# Patient Record
Sex: Male | Born: 1990 | Race: Black or African American | Hispanic: No | Marital: Single
Health system: Southern US, Community
[De-identification: ages and names within clinical notes are randomized; demographics above are authoritative.]

---
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
## 2019-06-08 ENCOUNTER — Encounter (HOSPITAL_COMMUNITY): Payer: Self-pay | Admitting: Emergency Medicine

## 2019-06-08 ENCOUNTER — Other Ambulatory Visit: Payer: Self-pay

## 2019-06-08 ENCOUNTER — Emergency Department (HOSPITAL_COMMUNITY)
Admission: EM | Admit: 2019-06-08 | Discharge: 2019-06-08 | Disposition: A | Discharge status: Home / Self Care | Payer: Self-pay | Attending: Emergency Medicine | Admitting: Emergency Medicine

## 2019-06-08 DIAGNOSIS — U071 COVID-19: Secondary | ICD-10-CM | POA: Insufficient documentation

## 2019-06-08 DIAGNOSIS — Z7189 Other specified counseling: Secondary | ICD-10-CM | POA: Insufficient documentation

## 2019-06-08 MED ORDER — ACETAMINOPHEN 325 MG PO TABS
650.0000 mg | ORAL_TABLET | Freq: Once | ORAL | Status: AC
  Administered 2019-06-08: 650 mg via ORAL
  Filled 2019-06-08: qty 2

## 2019-06-08 NOTE — ED Triage Notes (Signed)
Pt reports positive covid test on sept 25th, states he had mild symptoms of cough, h/a at that time, states he was told to quarantine for 10 days and be retested, retest was 10/6 and states he was still positive on 10/6, asymptomatic, states he wants to see if he can get antibiotics or some kind of medication for his positive test. Pt denies any symptoms at this time, resp e/u, nad.

## 2019-06-08 NOTE — ED Provider Notes (Signed)
[facility] EMERGENCY DEPARTMENT Provider Note   CSN: [id number] Arrival date & time: 06/08/19  6644     History   Chief Complaint Chief Complaint  Patient presents with  . covid +    HPI Terry Lynch is a 28 y.o. male.     The history is provided by the patient. No language interpreter was used.     28 year old male presenting to the ED requesting for antibiotic treatment for his COVID-19.  Patient report he developed cough and headache 2 weeks ago.  He was tested positive for COVID-19 on September 25.  He was instructed to quarantine for 10 days and and be retested.  Patient states he was retested on October 6 and it was indicated he is positive.  Patient mention initially he was having some mild headache, congestion, occasional cough, generalized fatigue and body aches.  Symptoms lasting for approximately 3 days and has since resolved.  He has been symptom-free for the past week.  He is here today requesting for antibiotic since he tested positive on retest 3 days ago.  Aside from mild temporal headache he does not have any other symptom.  History reviewed. No pertinent past medical history.  There are no active problems to display for this patient.   History reviewed. No pertinent surgical history.      Home Medications    Prior to Admission medications   Not on File    Family History No family history on file.  Social History Social History   Tobacco Use  . Smoking status: Not on file  Substance Use Topics  . Alcohol use: Not on file  . Drug use: Not on file     Allergies   Patient has no known allergies.   Review of Systems Review of Systems  All other systems reviewed and are negative.    Physical Exam Updated Vital Signs BP 117/81 (BP Location: Right Arm)   Pulse 70   Temp 98.4 F (36.9 C) (Oral)   Resp 20   SpO2 99%   Physical Exam Vitals signs and nursing note reviewed.  Constitutional:      General: He is not  in acute distress.    Appearance: He is well-developed.  HENT:     Head: Atraumatic.     Mouth/Throat:     Mouth: Mucous membranes are moist.  Eyes:     Conjunctiva/sclera: Conjunctivae normal.  Neck:     Musculoskeletal: Neck supple. No neck rigidity.  Cardiovascular:     Rate and Rhythm: Normal rate and regular rhythm.     Pulses: Normal pulses.     Heart sounds: Normal heart sounds.  Pulmonary:     Breath sounds: Normal breath sounds.  Abdominal:     Palpations: Abdomen is soft.  Skin:    Findings: No rash.  Neurological:     Mental Status: He is alert and oriented to person, place, and time.  Psychiatric:        Mood and Affect: Mood normal.      ED Treatments / Results  Labs (all labs ordered are listed, but only abnormal results are displayed) Labs Reviewed - No data to display  EKG None  Radiology No results found.  Procedures Procedures (including critical care time)  Medications Ordered in ED Medications - No data to display   Initial Impression / Assessment and Plan / ED Course  I have reviewed the triage vital signs and the nursing notes.  Pertinent labs &  imaging results that were available during my care of the patient were reviewed by me and considered in my medical decision making (see chart for details).        BP 117/81 (BP Location: Right Arm)   Pulse 70   Temp 98.4 F (36.9 C) (Oral)   Resp 20   SpO2 99%    Final Clinical Impressions(s) / ED Diagnoses   Final diagnoses:  Advice given about COVID-19 virus infection    ED Discharge Orders    None     10:12 AM Patient test positive for COVID-19 approximately 2 weeks ago.  He has been symptom-free for more than a week.  He did have a retest of COVID-19 approximately 3 days ago that came back positive even though he does not have any symptoms and he voiced concern and requesting for antibiotic.  I discussed with patient that antibiotic would not be beneficial in this setting.  I  felt patient is stable to return back to work since his symptom has abated and he has been in quarantine for more than 10 days.  Tylenol given for his headache.  Return precaution discussed.  Terry Lynch was evaluated in Emergency Department on 06/08/2019 for the symptoms described in the history of present illness. He was evaluated in the context of the global COVID-19 pandemic, which necessitated consideration that the patient might be at risk for infection with the SARS-CoV-2 virus that causes COVID-19. Institutional protocols and algorithms that pertain to the evaluation of patients at risk for COVID-19 are in a state of rapid change based on information released by regulatory bodies including the CDC and federal and state organizations. These policies and algorithms were followed during the patient's care in the ED.    Fayrene Helper, PA-C 06/08/19 1015    Raeford Razor, MD 06/08/19 1114

## 2019-06-08 NOTE — ED Notes (Signed)
Pt A&OX4, ambulatory at d/c with independent steady gait  

## 2019-06-26 ENCOUNTER — Other Ambulatory Visit: Payer: Self-pay

## 2019-06-26 DIAGNOSIS — Z202 Contact with and (suspected) exposure to infections with a predominantly sexual mode of transmission: Secondary | ICD-10-CM | POA: Insufficient documentation

## 2019-06-27 ENCOUNTER — Encounter (HOSPITAL_COMMUNITY): Payer: Self-pay | Admitting: Emergency Medicine

## 2019-06-27 ENCOUNTER — Emergency Department (HOSPITAL_COMMUNITY)
Admission: EM | Admit: 2019-06-27 | Discharge: 2019-06-27 | Disposition: A | Discharge status: Home / Self Care | Payer: Self-pay | Attending: Emergency Medicine | Admitting: Emergency Medicine

## 2019-06-27 ENCOUNTER — Other Ambulatory Visit: Payer: Self-pay

## 2019-06-27 DIAGNOSIS — Z202 Contact with and (suspected) exposure to infections with a predominantly sexual mode of transmission: Secondary | ICD-10-CM

## 2019-06-27 LAB — URINALYSIS, ROUTINE W REFLEX MICROSCOPIC
Bacteria, UA: NONE SEEN
Bilirubin Urine: NEGATIVE
Glucose, UA: NEGATIVE mg/dL
Hgb urine dipstick: NEGATIVE
Ketones, ur: NEGATIVE mg/dL
Leukocytes,Ua: NEGATIVE
Nitrite: NEGATIVE
Protein, ur: 30 mg/dL — AB
Specific Gravity, Urine: 1.035 — ABNORMAL HIGH (ref 1.005–1.030)
pH: 6 (ref 5.0–8.0)

## 2019-06-27 MED ORDER — CEFTRIAXONE SODIUM 250 MG IJ SOLR
250.0000 mg | Freq: Once | INTRAMUSCULAR | Status: AC
  Administered 2019-06-27: 04:00:00 250 mg via INTRAMUSCULAR
  Filled 2019-06-27: qty 250

## 2019-06-27 MED ORDER — AZITHROMYCIN 250 MG PO TABS
1000.0000 mg | ORAL_TABLET | Freq: Once | ORAL | Status: AC
Start: 2019-06-27 — End: 2019-06-27
  Administered 2019-06-27: 04:00:00 1000 mg via ORAL
  Filled 2019-06-27: qty 4

## 2019-06-27 MED ORDER — STERILE WATER FOR INJECTION IJ SOLN
INTRAMUSCULAR | Status: AC
  Filled 2019-06-27: qty 10

## 2019-06-27 NOTE — Discharge Instructions (Signed)
Safe sex practices. Please follow-up with your primary care doctor or health department for non-emergent STD screenings. Return here for any new/acute changes.

## 2019-06-27 NOTE — ED Provider Notes (Signed)
[facility] EMERGENCY DEPARTMENT Provider Note   CSN: [id number] Arrival date & time: 06/26/19  2359     History   Chief Complaint Chief Complaint  Patient presents with  . Exposure to STD    HPI Terry Lynch is a 28 y.o. male.     The history is provided by the patient and medical records.  Exposure to STD     28 y.o. M here requested STD screening.  States he has had a few sexual partners over the past 3 months.  States his most recent sexual partner from earlier this month was recently tested for STD's but was told she had some findings on exam that were concerning for STD.  He has not experienced any discharge, dysuria, hematuria, or penile lesions.  No rash.  Hx of STD in the past but was treated.  History reviewed. No pertinent past medical history.  There are no active problems to display for this patient.   History reviewed. No pertinent surgical history.      Home Medications    Prior to Admission medications   Not on File    Family History No family history on file.  Social History Social History   Tobacco Use  . Smoking status: Never Smoker  . Smokeless tobacco: Never Used  Substance Use Topics  . Alcohol use: Never    Frequency: Never  . Drug use: Never     Allergies   Patient has no known allergies.   Review of Systems Review of Systems  Genitourinary:       STD screening  All other systems reviewed and are negative.    Physical Exam Updated Vital Signs BP 111/76 (BP Location: Right Arm)   Pulse 80   Temp 98.5 F (36.9 C) (Oral)   Resp 19   SpO2 98%   Physical Exam Vitals signs and nursing note reviewed.  Constitutional:      Appearance: He is well-developed.  HENT:     Head: Normocephalic and atraumatic.  Eyes:     Conjunctiva/sclera: Conjunctivae normal.     Pupils: Pupils are equal, round, and reactive to light.  Neck:     Musculoskeletal: Normal range of motion.  Cardiovascular:     Rate  and Rhythm: Normal rate and regular rhythm.     Heart sounds: Normal heart sounds.  Pulmonary:     Effort: Pulmonary effort is normal.     Breath sounds: Normal breath sounds.  Abdominal:     General: Bowel sounds are normal.     Palpations: Abdomen is soft.  Genitourinary:    Comments: Chaperoned by NT Normal penis without discharge, no lesions noted Musculoskeletal: Normal range of motion.  Skin:    General: Skin is warm and dry.  Neurological:     Mental Status: He is alert and oriented to person, place, and time.      ED Treatments / Results  Labs (all labs ordered are listed, but only abnormal results are displayed) Labs Reviewed  URINALYSIS, ROUTINE W REFLEX MICROSCOPIC - Abnormal; Notable for the following components:      Result Value   APPearance HAZY (*)    Specific Gravity, Urine 1.035 (*)    Protein, ur 30 (*)    All other components within normal limits  GC/CHLAMYDIA PROBE AMP ([place]) NOT AT [facility]    EKG None  Radiology No results found.  Procedures Procedures (including critical care time)  Medications Ordered in ED Medications  sterile water (preservative free) injection (has no administration in time range)  cefTRIAXone (ROCEPHIN) injection 250 mg (250 mg Intramuscular Given 06/27/19 0337)  azithromycin (ZITHROMAX) tablet 1,000 mg (1,000 mg Oral Given 06/27/19 6979)     Initial Impression / Assessment and Plan / ED Course  I have reviewed the triage vital signs and the nursing notes.  Pertinent labs & imaging results that were available during my care of the patient were reviewed by me and considered in my medical decision making (see chart for details).   28 y.o. M here for STD screening.  He is asymptomatic but does report several sexual partners, one with some recent symptoms.  Gc/chl swab sent.  Treated empirically here with rocephin/azithromycin.  Advised to abstain from sexual activity for at least 1 week.  Will need to notify partners  if test is positive.  Recommended to follow-up with PCP and/or health dept for future STD screening.   Return here for new concerns.  Final Clinical Impressions(s) / ED Diagnoses   Final diagnoses:  Possible exposure to STD    ED Discharge Orders    None       Larene Pickett, PA-C 06/27/19 0433    Ward, Delice Bison, DO 06/27/19 [phone]

## 2019-06-27 NOTE — ED Triage Notes (Signed)
Patient requesting STD screening , patient stated multiple sex partners , denies any symptoms , no fever or chills .

## 2019-06-28 LAB — GC/CHLAMYDIA PROBE AMP (~~LOC~~) NOT AT ARMC
Chlamydia: NEGATIVE
Neisseria Gonorrhea: NEGATIVE

## 2020-03-09 ENCOUNTER — Emergency Department (HOSPITAL_COMMUNITY)
Admission: EM | Admit: 2020-03-09 | Discharge: 2020-03-09 | Disposition: A | Discharge status: Home / Self Care | Payer: Self-pay | Attending: Emergency Medicine | Admitting: Emergency Medicine

## 2020-03-09 ENCOUNTER — Emergency Department (HOSPITAL_COMMUNITY): Payer: Self-pay

## 2020-03-09 ENCOUNTER — Other Ambulatory Visit: Payer: Self-pay

## 2020-03-09 ENCOUNTER — Encounter (HOSPITAL_COMMUNITY): Payer: Self-pay

## 2020-03-09 DIAGNOSIS — S71031A Puncture wound without foreign body, right hip, initial encounter: Secondary | ICD-10-CM

## 2020-03-09 DIAGNOSIS — S30811A Abrasion of abdominal wall, initial encounter: Secondary | ICD-10-CM | POA: Insufficient documentation

## 2020-03-09 DIAGNOSIS — Y999 Unspecified external cause status: Secondary | ICD-10-CM | POA: Insufficient documentation

## 2020-03-09 DIAGNOSIS — Y9289 Other specified places as the place of occurrence of the external cause: Secondary | ICD-10-CM | POA: Insufficient documentation

## 2020-03-09 DIAGNOSIS — Z23 Encounter for immunization: Secondary | ICD-10-CM | POA: Insufficient documentation

## 2020-03-09 DIAGNOSIS — S71001A Unspecified open wound, right hip, initial encounter: Secondary | ICD-10-CM | POA: Insufficient documentation

## 2020-03-09 DIAGNOSIS — W3400XA Accidental discharge from unspecified firearms or gun, initial encounter: Secondary | ICD-10-CM | POA: Insufficient documentation

## 2020-03-09 DIAGNOSIS — S32424A Nondisplaced fracture of posterior wall of right acetabulum, initial encounter for closed fracture: Secondary | ICD-10-CM | POA: Insufficient documentation

## 2020-03-09 DIAGNOSIS — Y939 Activity, unspecified: Secondary | ICD-10-CM | POA: Insufficient documentation

## 2020-03-09 LAB — COMPREHENSIVE METABOLIC PANEL
ALT: 16 U/L (ref 0–44)
AST: 27 U/L (ref 15–41)
Albumin: 3.7 g/dL (ref 3.5–5.0)
Alkaline Phosphatase: 42 U/L (ref 38–126)
Anion gap: 11 (ref 5–15)
BUN: 11 mg/dL (ref 6–20)
CO2: 20 mmol/L — ABNORMAL LOW (ref 22–32)
Calcium: 8.7 mg/dL — ABNORMAL LOW (ref 8.9–10.3)
Chloride: 104 mmol/L (ref 98–111)
Creatinine, Ser: 0.96 mg/dL (ref 0.61–1.24)
GFR calc Af Amer: >60 mL/min (ref >60)
GFR calc non Af Amer: >60 mL/min (ref >60)
Glucose, Bld: 123 mg/dL — ABNORMAL HIGH (ref 70–99)
Potassium: 3.3 mmol/L — ABNORMAL LOW (ref 3.5–5.1)
Sodium: 135 mmol/L (ref 135–145)
Total Bilirubin: 0.6 mg/dL (ref 0.3–1.2)
Total Protein: 6.1 g/dL — ABNORMAL LOW (ref 6.5–8.1)

## 2020-03-09 LAB — CBC
HCT: 40.9 % (ref 39.0–52.0)
Hemoglobin: 13.2 g/dL (ref 13.0–17.0)
MCH: 26.5 pg (ref 26.0–34.0)
MCHC: 32.3 g/dL (ref 30.0–36.0)
MCV: 82.1 fL (ref 80.0–100.0)
Platelets: 266 10*3/uL (ref 150–400)
RBC: 4.98 MIL/uL (ref 4.22–5.81)
RDW: 14.8 % (ref 11.5–15.5)
WBC: 6.6 10*3/uL (ref 4.0–10.5)
nRBC: 0 % (ref 0.0–0.2)

## 2020-03-09 LAB — ETHANOL: Alcohol, Ethyl (B): <10 mg/dL (ref <10)

## 2020-03-09 LAB — CBG MONITORING, ED: Glucose-Capillary: 112 mg/dL — ABNORMAL HIGH (ref 70–99)

## 2020-03-09 LAB — SAMPLE TO BLOOD BANK

## 2020-03-09 LAB — PROTIME-INR
INR: 1.1 (ref 0.8–1.2)
Prothrombin Time: 13.6 seconds (ref 11.4–15.2)

## 2020-03-09 MED ORDER — FENTANYL CITRATE (PF) 100 MCG/2ML IJ SOLN: 100.0000 ug | Freq: Once | INTRAMUSCULAR | Status: AC

## 2020-03-09 MED ORDER — IOHEXOL 300 MG/ML  SOLN
100.0000 mL | Freq: Once | INTRAMUSCULAR | Status: AC | PRN
  Administered 2020-03-09: 100 mL via INTRAVENOUS

## 2020-03-09 MED ORDER — SODIUM CHLORIDE 0.9 % IV BOLUS
1000.0000 mL | Freq: Once | INTRAVENOUS | Status: AC
  Administered 2020-03-09: 1000 mL via INTRAVENOUS

## 2020-03-09 MED ORDER — TETANUS-DIPHTH-ACELL PERTUSSIS 5-2.5-18.5 LF-MCG/0.5 IM SUSP
0.5000 mL | Freq: Once | INTRAMUSCULAR | Status: AC
  Administered 2020-03-09: 0.5 mL via INTRAMUSCULAR
  Filled 2020-03-09: qty 0.5

## 2020-03-09 MED ORDER — FENTANYL CITRATE (PF) 100 MCG/2ML IJ SOLN
INTRAMUSCULAR | Status: AC
  Administered 2020-03-09: 100 ug via INTRAVENOUS
  Filled 2020-03-09: qty 2

## 2020-03-09 MED ORDER — KETOROLAC TROMETHAMINE 30 MG/ML IJ SOLN
30.0000 mg | Freq: Once | INTRAMUSCULAR | Status: AC
  Administered 2020-03-09: 30 mg via INTRAVENOUS
  Filled 2020-03-09: qty 1

## 2020-03-09 MED ORDER — HYDROCODONE-ACETAMINOPHEN 5-325 MG PO TABS: 1.0000 | ORAL_TABLET | Freq: Four times a day (QID) | ORAL | 0 refills | Status: AC | PRN
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
## 2020-03-09 NOTE — ED Notes (Signed)
Discharge instructions discussed with pt. Pt verbalized understanding. Pt stable and ambulatory. No signature pad available. 

## 2020-03-09 NOTE — ED Notes (Signed)
Pt educated on use of crutches. Pt ambulated well independently with crutches
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
## 2020-03-09 NOTE — ED Notes (Signed)
Pt transported to CT at this time.
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
## 2020-03-10 ENCOUNTER — Encounter (HOSPITAL_COMMUNITY): Payer: Self-pay | Admitting: Emergency Medicine

## 2020-03-19 ENCOUNTER — Other Ambulatory Visit: Payer: Self-pay

## 2020-03-19 ENCOUNTER — Ambulatory Visit: Payer: Self-pay | Admitting: Orthopaedic Surgery

## 2020-03-19 ENCOUNTER — Encounter: Payer: Self-pay | Admitting: Orthopaedic Surgery

## 2020-03-19 DIAGNOSIS — W3400XA Accidental discharge from unspecified firearms or gun, initial encounter: Secondary | ICD-10-CM

## 2020-03-19 DIAGNOSIS — S71031A Puncture wound without foreign body, right hip, initial encounter: Secondary | ICD-10-CM
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
## 2020-04-02 ENCOUNTER — Encounter: Payer: Self-pay | Admitting: Orthopaedic Surgery

## 2020-04-02 ENCOUNTER — Other Ambulatory Visit: Payer: Self-pay

## 2020-04-02 ENCOUNTER — Ambulatory Visit (INDEPENDENT_AMBULATORY_CARE_PROVIDER_SITE_OTHER): Payer: Self-pay | Admitting: Orthopaedic Surgery

## 2020-04-02 VITALS — Ht 70.0 in | Wt 165.0 lb

## 2020-04-02 DIAGNOSIS — S71031D Puncture wound without foreign body, right hip, subsequent encounter: Secondary | ICD-10-CM

## 2020-04-02 DIAGNOSIS — W3400XD Accidental discharge from unspecified firearms or gun, subsequent encounter: Secondary | ICD-10-CM
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
## 2020-04-24 ENCOUNTER — Ambulatory Visit: Payer: Self-pay | Admitting: Orthopaedic Surgery

## 2020-05-15 ENCOUNTER — Telehealth: Payer: Self-pay | Admitting: Orthopaedic Surgery

## 2020-05-20 ENCOUNTER — Telehealth: Payer: Self-pay | Admitting: Orthopaedic Surgery

## 2020-05-20 NOTE — Telephone Encounter (Signed)
Patient called back, stating he received only after visit summary. I emailed auth to recruiters office where patient is and patient will complete there. [email or web address]

## 2020-05-20 NOTE — Telephone Encounter (Signed)
Received note to call patient about copy of records. I called patient and he stated he came in and someone printed them for him. I asked him if he signed a release form, he stated yes. He did not need anything else.[phone]

## 2020-05-21 ENCOUNTER — Telehealth: Payer: Self-pay | Admitting: Orthopaedic Surgery

## 2020-05-21 NOTE — Telephone Encounter (Signed)
Received release form from patient. He needs records to be sent to  [employer] recruitment office. With his verbal [name], records sent and spoke with [employer]. Allen Norris, they don't have a fax. Records emailed [email or web address]. pts ph 336 [phone]

## 2020-05-22 ENCOUNTER — Telehealth: Payer: Self-pay | Admitting: Orthopaedic Surgery

## 2020-05-22 NOTE — Telephone Encounter (Signed)
Patient called. He is requesting a note stating he is released from care. He states he didn't f/u because he felt better. Needs this note for [employer] recruitment. Callback [phone]

## 2020-05-23 NOTE — Telephone Encounter (Signed)
Please advise if note can be done without follow up.
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
## 2020-05-26 NOTE — Telephone Encounter (Signed)
Called and advised patient that note will be up front.

## 2020-05-28 ENCOUNTER — Telehealth: Payer: Self-pay | Admitting: Orthopaedic Surgery

## 2020-05-28 NOTE — Telephone Encounter (Signed)
Patient called asking that is release note be emailed to his recruiter. I emailed [email or web address] [employer]
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
